# Patient Record
Sex: Male | Born: 1980 | Hispanic: No | Marital: Married
Health system: Southern US, Community
[De-identification: ages and names within clinical notes are randomized; demographics above are authoritative.]

---

## 2020-04-16 ENCOUNTER — Ambulatory Visit (HOSPITAL_BASED_OUTPATIENT_CLINIC_OR_DEPARTMENT_OTHER)
Admission: RE | Admit: 2020-04-16 | Discharge: 2020-04-16 | Disposition: A | Discharge status: Home / Self Care | Payer: 59 | Source: Ambulatory Visit | Attending: Medical | Admitting: Medical

## 2020-04-16 ENCOUNTER — Other Ambulatory Visit: Payer: Self-pay

## 2020-04-16 ENCOUNTER — Ambulatory Visit: Payer: 59 | Admitting: Medical

## 2020-04-16 VITALS — BP 102/58 | HR 103 | Temp 98.4°F | Resp 18 | Ht 68.0 in | Wt 173.4 lb

## 2020-04-16 DIAGNOSIS — Z23 Encounter for immunization: Secondary | ICD-10-CM | POA: Diagnosis not present

## 2020-04-16 DIAGNOSIS — L089 Local infection of the skin and subcutaneous tissue, unspecified: Secondary | ICD-10-CM | POA: Diagnosis not present

## 2020-04-16 DIAGNOSIS — R5383 Other fatigue: Secondary | ICD-10-CM | POA: Diagnosis not present

## 2020-04-16 DIAGNOSIS — M25562 Pain in left knee: Secondary | ICD-10-CM | POA: Insufficient documentation

## 2020-04-16 DIAGNOSIS — Z0001 Encounter for general adult medical examination with abnormal findings: Secondary | ICD-10-CM | POA: Diagnosis not present

## 2020-04-16 DIAGNOSIS — Z113 Encounter for screening for infections with a predominantly sexual mode of transmission: Secondary | ICD-10-CM

## 2020-04-16 DIAGNOSIS — Z0000 Encounter for general adult medical examination without abnormal findings: Secondary | ICD-10-CM

## 2020-04-16 MED ORDER — CEPHALEXIN 500 MG PO CAPS: 500.0000 mg | ORAL_CAPSULE | Freq: Two times a day (BID) | ORAL | 0 refills | Status: AC

## 2020-04-16 NOTE — Patient Instructions (Addendum)
For you wellness exam today I have ordered cbc, cmp, tsh, lipid panel, ua and hiv.  Recommend exercise and healthy diet.  tdap vaccine today.  We will let you know lab results as they come in.  For knee pain get xray and refer to sports med MD.  For fatigue add vit d, b12, b1 and tsh.  For depression, since you report issues at work are changing will hold off and see if you improve. But if persists can give med.  For skin infection hx rt nipple rx keflex antibiotic. Need to check area on follow up to see if area normalizes or if needs further work up.  Follow up date 1 month or as needed

## 2020-04-16 NOTE — Progress Notes (Signed)
   Subjective:    Patient ID: Mariel Craft, male    DOB: [birth year], 39 y.o.   MRN: [id number]  HPI  Pt first time with me.  Pt works at [employer]. Pt not exercising. Eating healthy diet. Rare alcohol, non smoker. Married- one child and wife pregnant.    Pt states some fatigue, stressed, some depressed mood(2 months associated with work) and work pressure. He work about 12 hour day.   Pt does not want to be on med for depression. Mild-moderate at most described. Not severe. Improving with better work conditions just recently.  Pt want physical also.   Pt did get covid vaccine. Both series. Will get tdap today.  Pt hurt his rt knee climbing onto bed 3 weeks ago. He heard small noise. Since then has  Mild to moderatepain. Injury about 3 weeks ago. Pain 3/10 today    Review of Systems  Constitutional: Negative for chills, fatigue and fever.  Respiratory: Negative for choking, shortness of breath and wheezing.   Cardiovascular: Negative for chest pain and palpitations.  Gastrointestinal: Negative for abdominal pain, constipation, nausea and vomiting.  Musculoskeletal: Negative for back pain.  Skin: Negative for rash.       One month of yellow dc that came up on rt nipple. No further dx but slight bump still present. No milky dc.   Neurological: Negative for dizziness, syncope, weakness, numbness and headaches.  Psychiatric/Behavioral: Positive for dysphoric mood. Negative for agitation and suicidal ideas. The patient is not nervous/anxious.        Objective:   Physical Exam  General Mental Status- Alert. General Appearance- Not in acute distress.   Skin General: Color- Normal Color. Moisture- Normal Moisture.  Neck Carotid Arteries- Normal color. Moisture- Normal Moisture. No carotid bruits. No JVD.  Chest and Lung Exam Auscultation: Breath Sounds:-Normal.  Cardiovascular Auscultation:Rythm- Regular. Murmurs & Other Heart Sounds:Auscultation of the heart reveals- No  Murmurs.  Abdomen Inspection:-Inspeection Normal. Palpation/Percussion:Note:No mass. Palpation and Percussion of the abdomen reveal- Non Tender, Non Distended + BS, no rebound or guarding.    Neurologic Cranial Nerve exam:- CN III-XII intact(No nystagmus), symmetric smile. Finger to Nose:- Normal/Intact Strength:- 5/5 equal and symmetric strength both upper and lower extremities.  Left knee- no swelling. No crepitus. Good rom. Mid tender to palpation over medial tibial plateau region.  Anterior chest- by rt nipple very tiny raised lump. No redness. No gynecomastia. No dc from either nipple.     Assessment & Plan:  For you wellness exam today I have ordered cbc, cmp, tsh, lipid panel, ua and hiv.  Recommend exercise and healthy diet.  tdap vaccine today.  We will let you know lab results as they come in.  For knee pain get xray and refer to sports med MD.  For fatigue add vit d, b12, b1 and tsh.  For depression, since you report issues at work are changing will hold off and see if you improve. But if persists can give med.  Follow up date 1 month or as needed  Esperanza Richters, [place]   [id number] charge extra time 15 minutes in addtion to wellness to address fatigue, knee pain, skin infection and depression.

## 2020-04-21 ENCOUNTER — Other Ambulatory Visit: Payer: Self-pay

## 2020-04-21 ENCOUNTER — Other Ambulatory Visit (INDEPENDENT_AMBULATORY_CARE_PROVIDER_SITE_OTHER): Payer: 59

## 2020-04-21 DIAGNOSIS — Z113 Encounter for screening for infections with a predominantly sexual mode of transmission: Secondary | ICD-10-CM

## 2020-04-21 DIAGNOSIS — Z0000 Encounter for general adult medical examination without abnormal findings: Secondary | ICD-10-CM | POA: Diagnosis not present

## 2020-04-21 DIAGNOSIS — R5383 Other fatigue: Secondary | ICD-10-CM | POA: Diagnosis not present

## 2020-04-21 LAB — COMPREHENSIVE METABOLIC PANEL
ALT: 26 U/L (ref 0–53)
AST: 23 U/L (ref 0–37)
Albumin: 4.4 g/dL (ref 3.5–5.2)
Alkaline Phosphatase: 64 U/L (ref 39–117)
BUN: 10 mg/dL (ref 6–23)
CO2: 28 mEq/L (ref 19–32)
Calcium: 9.1 mg/dL (ref 8.4–10.5)
Chloride: 105 mEq/L (ref 96–112)
Creatinine, Ser: 1.08 mg/dL (ref 0.40–1.50)
GFR: 76.91 mL/min (ref >60.00)
Glucose, Bld: 97 mg/dL (ref 70–99)
Potassium: 4.2 mEq/L (ref 3.5–5.1)
Sodium: 137 mEq/L (ref 135–145)
Total Bilirubin: 0.7 mg/dL (ref 0.2–1.2)
Total Protein: 7.2 g/dL (ref 6.0–8.3)

## 2020-04-21 LAB — CBC WITH DIFFERENTIAL/PLATELET
Basophils Absolute: 0 10*3/uL (ref 0.0–0.1)
Basophils Relative: 0.7 % (ref 0.0–3.0)
Eosinophils Absolute: 0.2 10*3/uL (ref 0.0–0.7)
Eosinophils Relative: 4.2 % (ref 0.0–5.0)
HCT: 47.7 % (ref 39.0–52.0)
Hemoglobin: 16.6 g/dL (ref 13.0–17.0)
Lymphocytes Relative: 33.9 % (ref 12.0–46.0)
Lymphs Abs: 1.9 10*3/uL (ref 0.7–4.0)
MCHC: 34.7 g/dL (ref 30.0–36.0)
MCV: 86.7 fl (ref 78.0–100.0)
Monocytes Absolute: 0.6 10*3/uL (ref 0.1–1.0)
Monocytes Relative: 10.8 % (ref 3.0–12.0)
Neutro Abs: 2.9 10*3/uL (ref 1.4–7.7)
Neutrophils Relative %: 50.4 % (ref 43.0–77.0)
Platelets: 155 10*3/uL (ref 150.0–400.0)
RBC: 5.51 Mil/uL (ref 4.22–5.81)
RDW: 13.1 % (ref 11.5–15.5)
WBC: 5.7 10*3/uL (ref 4.0–10.5)

## 2020-04-21 LAB — LIPID PANEL
Cholesterol: 154 mg/dL (ref 0–200)
HDL: 34.1 mg/dL — ABNORMAL LOW (ref >39.00)
LDL Cholesterol: 91 mg/dL (ref 0–99)
NonHDL: 119.5
Total CHOL/HDL Ratio: 5
Triglycerides: 144 mg/dL (ref 0.0–149.0)
VLDL: 28.8 mg/dL (ref 0.0–40.0)

## 2020-04-21 LAB — VITAMIN B12: Vitamin B-12: 80 pg/mL — ABNORMAL LOW (ref 211–911)

## 2020-04-21 LAB — TSH: TSH: 1.09 u[IU]/mL (ref 0.35–4.50)

## 2020-04-22 ENCOUNTER — Encounter: Payer: Self-pay | Admitting: Medical

## 2020-04-25 ENCOUNTER — Other Ambulatory Visit: Payer: Self-pay

## 2020-04-25 ENCOUNTER — Ambulatory Visit (INDEPENDENT_AMBULATORY_CARE_PROVIDER_SITE_OTHER): Payer: 59

## 2020-04-25 DIAGNOSIS — E538 Deficiency of other specified B group vitamins: Secondary | ICD-10-CM

## 2020-04-25 LAB — VITAMIN D 1,25 DIHYDROXY
Vitamin D 1, 25 (OH)2 Total: 37 pg/mL (ref 18–72)
Vitamin D2 1, 25 (OH)2: <8 pg/mL
Vitamin D3 1, 25 (OH)2: 37 pg/mL

## 2020-04-25 LAB — HIV ANTIBODY (ROUTINE TESTING W REFLEX): HIV 1&2 Ab, 4th Generation: NONREACTIVE

## 2020-04-25 MED ORDER — CYANOCOBALAMIN 1000 MCG/ML IJ SOLN: 1000.0000 ug | Freq: Once | INTRAMUSCULAR | 0 refills | Status: AC

## 2020-04-25 MED ORDER — CYANOCOBALAMIN 1000 MCG/ML IJ SOLN
1000.0000 ug | Freq: Once | INTRAMUSCULAR | Status: AC
  Administered 2020-04-25: 1000 ug via INTRAMUSCULAR

## 2020-04-25 NOTE — Addendum Note (Signed)
Addended by: Maximino Sarin on: 04/25/2020 10:26 AM   Modules accepted: Orders

## 2020-04-25 NOTE — Progress Notes (Signed)
Pt here for weekly B12 injection per Marisue Brooklyn  B12 given IM, right deltoid , and pt tolerated injection well.  Next B12 injection scheduled for next week .

## 2020-04-27 LAB — VITAMIN B1: Vitamin B1 (Thiamine): 10 nmol/L (ref 8–30)

## 2020-04-30 ENCOUNTER — Ambulatory Visit: Payer: 59 | Admitting: Family Medicine

## 2020-04-30 ENCOUNTER — Other Ambulatory Visit: Payer: Self-pay

## 2020-04-30 ENCOUNTER — Encounter: Payer: Self-pay | Admitting: Family Medicine

## 2020-04-30 ENCOUNTER — Ambulatory Visit: Payer: Self-pay

## 2020-04-30 ENCOUNTER — Ambulatory Visit (INDEPENDENT_AMBULATORY_CARE_PROVIDER_SITE_OTHER): Payer: 59

## 2020-04-30 VITALS — BP 120/73 | HR 59 | Ht 68.0 in | Wt 169.0 lb

## 2020-04-30 DIAGNOSIS — M25562 Pain in left knee: Secondary | ICD-10-CM

## 2020-04-30 DIAGNOSIS — S83002A Unspecified subluxation of left patella, initial encounter: Secondary | ICD-10-CM | POA: Diagnosis not present

## 2020-04-30 DIAGNOSIS — E538 Deficiency of other specified B group vitamins: Secondary | ICD-10-CM

## 2020-04-30 MED ORDER — CYANOCOBALAMIN 1000 MCG/ML IJ SOLN
1000.0000 ug | Freq: Once | INTRAMUSCULAR | Status: AC
  Administered 2020-04-30: 1000 ug via INTRAMUSCULAR

## 2020-04-30 MED ORDER — PENNSAID 2 % EX SOLN: 1.0000 "application " | Freq: Two times a day (BID) | CUTANEOUS | 3 refills | Status: AC

## 2020-04-30 NOTE — Progress Notes (Signed)
William Glass - 39 y.o. male MRN [id number]  Date of birth: Aug 19, 1981  SUBJECTIVE:  Including CC & ROS.  Chief Complaint  Patient presents with   Knee Injury    left x 03/27/2020    William Glass is a 39 y.o. male that is presenting with left knee pain.  He was getting into bed and felt a pop in his knee.  During that night he had severe pain and was unable to sleep.  The pain is slowly improved.  He has quit running and working out.  He denies any significant pain at rest.  The pain is worse with any running.  No history of surgery.  It is localized to the knee..  Independent review of the left knee x-ray from 7/21 shows no acute abnormality.   Review of Systems See HPI   HISTORY: Past Medical, Surgical, Social, and Family History Reviewed & Updated per EMR.   Pertinent Historical Findings include:  History reviewed. No pertinent past medical history.  History reviewed. No pertinent surgical history.  History reviewed. No pertinent family history.  Social History   Socioeconomic History   Marital status: Married    Spouse name: Not on file   Number of children: Not on file   Years of education: Not on file   Highest education level: Not on file  Occupational History   Not on file  Tobacco Use   Smoking status: Not on file  Substance and Sexual Activity   Alcohol use: Not on file   Drug use: Not on file   Sexual activity: Not on file  Other Topics Concern   Not on file  Social History Narrative   Not on file   Social Determinants of Health   Financial Resource Strain:    Difficulty of Paying Living Expenses:   Food Insecurity:    Worried About Running Out of Food in the Last Year:    Merchant navy officer of Food in the Last Year:   Transportation Needs:    Freight forwarder (Medical):    Lack of Transportation (Non-Medical):   Physical Activity:    Days of Exercise per Week:    Minutes of Exercise per Session:   Stress:    Feeling of Stress :     Social Connections:    Frequency of Communication with Friends and Family:    Frequency of Social Gatherings with Friends and Family:    Attends Religious Services:    Active Member of Clubs or Organizations:    Attends Engineer, structural:    Marital Status:   Intimate Partner Violence:    Fear of Current or Ex-Partner:    Emotionally Abused:    Physically Abused:    Sexually Abused:      PHYSICAL EXAM:  VS: BP 120/73    Pulse (!) 59    Ht 5\' 8"  (1.727 m)    Wt 169 lb (76.7 kg)    BMI 25.70 kg/m  Physical Exam Gen: NAD, alert, cooperative with exam, well-appearing MSK:  Left knee: No obvious effusion. Normal range of motion. No instability with valgus or varus stress testing. Normal strength resistance. Negative Thessaly test. Neurovascularly intact  Limited ultrasound: Left knee:  Mild effusion of the suprapatellar pouch. Normal-appearing quadricep and patellar tendon. Normal-appearing medial joint space and meniscus. Normal-appearing lateral joint space and meniscus  Summary:  mild effusion on exam  Ultrasound and interpretation by , MD    ASSESSMENT & PLAN:   Patellar subluxation,  left, initial encounter Given the mechanism of an injury and the amount of pain that is and it sounds like he had a subluxation.  There is a mild effusion on exam.  The meniscus testing was reassuring. -Counseled on home exercise therapy and supportive care. -Pennsaid. -Counseled on compression. -Could consider physical therapy or further imaging if needed.

## 2020-04-30 NOTE — Patient Instructions (Signed)
Nice to meet you Please try ice  Please try the rub on medicine  Please try compression  Please try the exercises  Please send me a message in MyChart with any questions or updates.  Please see me back in 4 weeks.   --Dr. Jordan Likes

## 2020-04-30 NOTE — Progress Notes (Addendum)
Pt here for weekly B12 injection per Marisue Brooklyn  B12 given IM,right deltoid  and pt tolerated injection well.  Next B12 injection scheduled for next week  Agree with administration b12 injection  for low b12.  Esperanza Richters, PA-C

## 2020-04-30 NOTE — Assessment & Plan Note (Signed)
Given the mechanism of an injury and the amount of pain that is and it sounds like he had a subluxation.  There is a mild effusion on exam.  The meniscus testing was reassuring. -Counseled on home exercise therapy and supportive care. -Pennsaid. -Counseled on compression. -Could consider physical therapy or further imaging if needed.

## 2020-05-07 ENCOUNTER — Other Ambulatory Visit: Payer: Self-pay

## 2020-05-07 ENCOUNTER — Ambulatory Visit (INDEPENDENT_AMBULATORY_CARE_PROVIDER_SITE_OTHER): Payer: 59

## 2020-05-07 DIAGNOSIS — E538 Deficiency of other specified B group vitamins: Secondary | ICD-10-CM | POA: Diagnosis not present

## 2020-05-07 MED ORDER — CYANOCOBALAMIN 1000 MCG/ML IJ SOLN
1000.0000 ug | Freq: Once | INTRAMUSCULAR | Status: AC
Start: 2020-05-07 — End: 2020-05-07
  Administered 2020-05-07: 1000 ug via INTRAMUSCULAR

## 2020-05-07 NOTE — Progress Notes (Addendum)
Pt here for weekly B12 injection per Marisue Brooklyn  B12 given IM, right deltoidand pt tolerated injection well.  Next B12 injection scheduled for next week.  Noted. Agree with above.  Jilda Roche [place], DO 05/21/20 12:15 PM

## 2020-05-09 ENCOUNTER — Ambulatory Visit: Payer: 59

## 2020-05-14 ENCOUNTER — Other Ambulatory Visit: Payer: Self-pay

## 2020-05-14 ENCOUNTER — Ambulatory Visit (INDEPENDENT_AMBULATORY_CARE_PROVIDER_SITE_OTHER): Payer: 59

## 2020-05-14 DIAGNOSIS — E538 Deficiency of other specified B group vitamins: Secondary | ICD-10-CM | POA: Diagnosis not present

## 2020-05-14 MED ORDER — CYANOCOBALAMIN 1000 MCG/ML IJ SOLN
1000.0000 ug | Freq: Once | INTRAMUSCULAR | Status: AC
  Administered 2020-05-14: 1000 ug via INTRAMUSCULAR

## 2020-05-14 NOTE — Progress Notes (Addendum)
Pt here for weekly B12 injection per Marisue Brooklyn  B12 given IM, right deltoid and pt tolerated injection well.  4 out of 4 weekly injections completed , follow up with PCP next week.   Agree with administration for low b12.  Esperanza Richters, PA-C

## 2020-05-20 ENCOUNTER — Other Ambulatory Visit: Payer: Self-pay

## 2020-05-20 ENCOUNTER — Ambulatory Visit (INDEPENDENT_AMBULATORY_CARE_PROVIDER_SITE_OTHER): Payer: 59 | Admitting: Medical

## 2020-05-20 VITALS — BP 108/51 | HR 68 | Temp 98.4°F | Resp 18 | Ht 68.0 in | Wt 173.4 lb

## 2020-05-20 DIAGNOSIS — L723 Sebaceous cyst: Secondary | ICD-10-CM

## 2020-05-20 DIAGNOSIS — L659 Nonscarring hair loss, unspecified: Secondary | ICD-10-CM

## 2020-05-20 DIAGNOSIS — E538 Deficiency of other specified B group vitamins: Secondary | ICD-10-CM

## 2020-05-20 MED ORDER — CEPHALEXIN 500 MG PO CAPS
500.0000 mg | ORAL_CAPSULE | Freq: Two times a day (BID) | ORAL | 0 refills | Status: AC
Start: 2020-05-20 — End: ?

## 2020-05-20 NOTE — Patient Instructions (Addendum)
Your b12 was low and have had supplementation now for one month.  Will repeat level within next week and decide on what level of supplementation will need going forward.  For rt nipple healing with possible sebacious cyst vs infection will rx 4 more days of keflex and advise warm compresses. If area does not heal then will refer to derm to evaluate the area. Maybe they can better view magnification. After discussion will go ahead and refer also for hair loss in beard.  Follow up date to be determined after lab review.   Vitamin B 12 Deficiency Vitamin B 12 deficiency occurs when the body does not have enough vitamin B12, which is an important vitamin. The body needs this vitamin:  To make red blood cells.  To make DNA. This is the genetic material inside cells.  To help the nerves work properly so they can carry messages from the brain to the body. Vitamin B12 deficiency can cause various health problems, such as a low red blood cell count (anemia) or nerve damage. What are the causes? This condition may be caused by:  Not eating enough foods that contain vitamin B12.  Not having enough stomach acid and digestive fluids to properly absorb vitamin B12 from the food that you eat.  Certain digestive system diseases that make it hard to absorb vitamin B12. These diseases include Crohn's disease, chronic pancreatitis, and cystic fibrosis.  A condition in which the body does not make enough of a protein (intrinsic factor), resulting in too few red blood cells (pernicious anemia).  Having a surgery in which part of the stomach or small intestine is removed.  Taking certain medicines that make it hard for the body to absorb vitamin B12. These medicines include: ? Heartburn medicines (antacids and proton pump inhibitors). ? Certain antibiotic medicines. ? Some medicines that are used to treat diabetes, tuberculosis, gout, or high cholesterol. What increases the risk? The following factors  may make you more likely to develop a B12 deficiency:  Being older than age 33.  Eating a vegetarian or vegan diet, especially while you are pregnant.  Eating a poor diet while you are pregnant.  Taking certain medicines.  Having alcoholism. What are the signs or symptoms? In some cases, there are no symptoms of this condition. If the condition leads to anemia or nerve damage, various symptoms can occur, such as:  Weakness.  Fatigue.  Loss of appetite.  Weight loss.  Numbness or tingling in your hands and feet.  Redness and burning of the tongue.  Confusion or memory problems.  Depression.  Sensory problems, such as color blindness, ringing in the ears, or loss of taste.  Diarrhea or constipation.  Trouble walking. If anemia is severe, symptoms can include:  Shortness of breath.  Dizziness.  Rapid heart rate (tachycardia). How is this diagnosed? This condition may be diagnosed with a blood test to measure the level of vitamin B12 in your blood. You may also have other tests, including:  A group of tests that measure certain characteristics of blood cells (complete blood count, CBC).  A blood test to measure intrinsic factor.  A procedure where a thin tube with a camera on the end is used to look into your stomach or intestines (endoscopy). Other tests may be needed to discover the cause of B12 deficiency. How is this treated? Treatment for this condition depends on the cause. This condition may be treated by:  Changing your eating and drinking habits, such as: ?  Eating more foods that contain vitamin B12. ? Drinking less alcohol or no alcohol.  Getting vitamin B12 injections.  Taking vitamin B12 supplements. Your health care provider will tell you which dosage is best for you. Follow these instructions at home: Eating and drinking   Eat lots of healthy foods that contain vitamin B12, including: ? Meats and poultry. This includes beef, pork, chicken,  [place], and organ meats, such as liver. ? Seafood. This includes clams, rainbow trout, salmon, tuna, and haddock. ? Eggs. ? Cereal and dairy products that are fortified. This means that vitamin B12 has been added to the food. Check the label on the package to see if the food is fortified. The items listed above may not be a complete list of recommended foods and beverages. Contact a dietitian for more information. General instructions  Get any injections that are prescribed by your health care provider.  Take supplements only as told by your health care provider. Follow the directions carefully.  Do not drink alcohol if your health care provider tells you not to. In some cases, you may only be asked to limit alcohol use.  Keep all follow-up visits as told by your health care provider. This is important. Contact a health care provider if:  Your symptoms come back. Get help right away if you:  Develop shortness of breath.  Have a rapid heart rate.  Have chest pain.  Become dizzy or lose consciousness. Summary  Vitamin B12 deficiency occurs when the body does not have enough vitamin B12.  The main causes of vitamin B12 deficiency include dietary deficiency, digestive diseases, pernicious anemia, and having a surgery in which part of the stomach or small intestine is removed.  In some cases, there are no symptoms of this condition. If the condition leads to anemia or nerve damage, various symptoms can occur, such as weakness, shortness of breath, and numbness.  Treatment may include getting vitamin B12 injections or taking vitamin B12 supplements. Eat lots of healthy foods that contain vitamin B12. This information is not intended to replace advice given to you by your health care provider. Make sure you discuss any questions you have with your health care provider. Document Revised: 03/02/2019 Document Reviewed: 05/23/2018 Elsevier Patient Education  2020 [employer].

## 2020-05-20 NOTE — Progress Notes (Signed)
   Subjective:    Patient ID: William Glass, male    DOB: Sep 05, 1981, 39 y.o.   MRN: [id number]  HPI  Pt in for follow up.  Pt has low b12 in past. Now has done 4 consecutive weeks of b12 injections and has been on otc b12 as well. 2500 mcg dose.  He states his energy is much improved now. Still occasional fatigue.  Also follow up on rt nipple area which had dc. Pt took antibiotic and now her for follow up. Area better but still tiny white bump.  Also recent thinning of beard on left sid.e  Review of Systems     Objective:   Physical Exam  General- No acute distress. Pleasant patient. Neck- Full range of motion, no jvd Lungs- Clear, even and unlabored. Heart- regular rate and rhythm. Neurologic- CNII- XII grossly intact. Skin- rt nipple. Area better. Now has very tiny raise white bump. Also left side of beard small area of thinning hair.      Assessment & Plan:  Your b12 was low and have had supplementation now for one month.  Will repeat level within next week and decide on what level of supplementation will need going forward.  Follow up date to be determined after lab review.  Time spent with patient today was 20  minutes which consisted of chart revdiew, discussing diagnosis, work up, treatment, referral and documentation.

## 2020-05-30 ENCOUNTER — Ambulatory Visit: Payer: 59

## 2020-05-30 ENCOUNTER — Other Ambulatory Visit: Payer: 59

## 2020-05-30 ENCOUNTER — Other Ambulatory Visit: Payer: Self-pay

## 2020-05-30 DIAGNOSIS — E538 Deficiency of other specified B group vitamins: Secondary | ICD-10-CM

## 2020-05-31 LAB — VITAMIN B12: Vitamin B-12: 768 pg/mL (ref 200–1100)

## 2020-06-03 ENCOUNTER — Ambulatory Visit: Payer: 59 | Admitting: Family Medicine

## 2020-06-20 ENCOUNTER — Ambulatory Visit: Payer: 59

## 2021-07-13 IMAGING — DX DG KNEE 3 VIEWS*L*
3 series · 3 of 3 positions shown · non-contrast
Comparison: None.

CLINICAL DATA: Knee pain after injury 1 week ago.

EXAM:
LEFT KNEE - 3 VIEW

[knee ap]
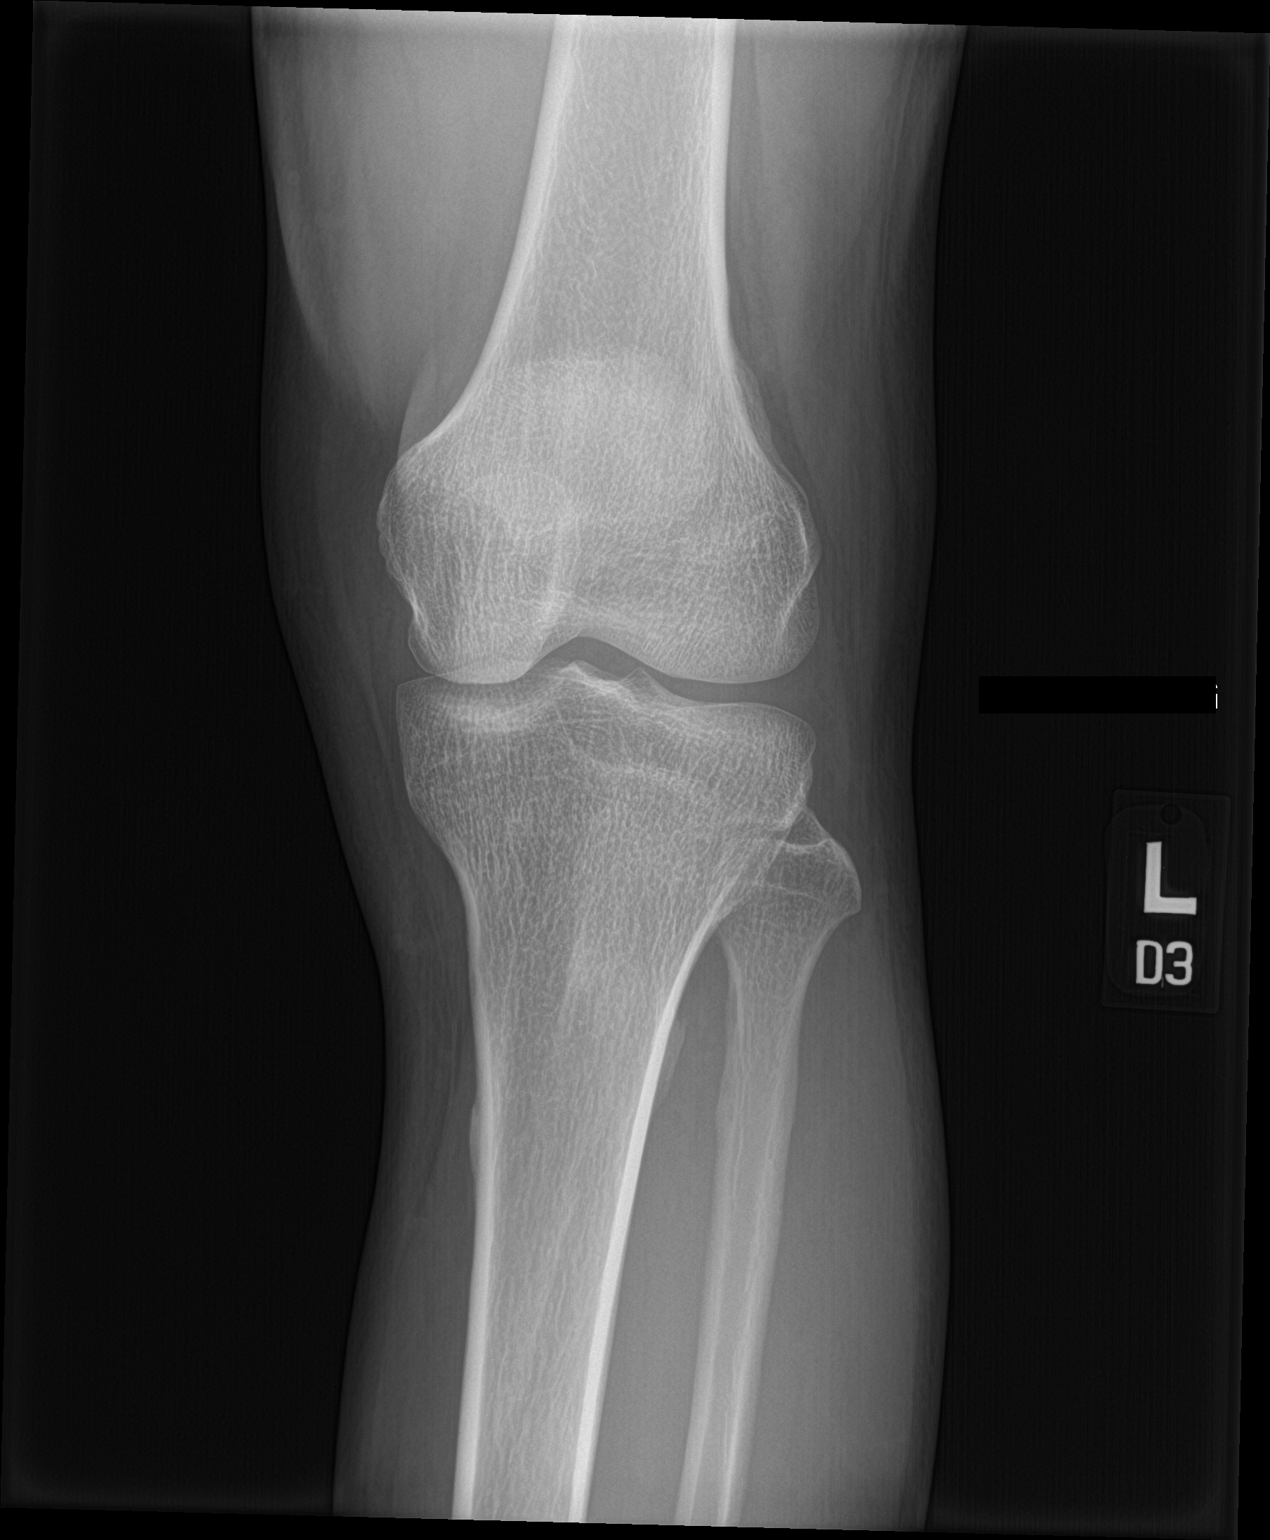

[knee lat]
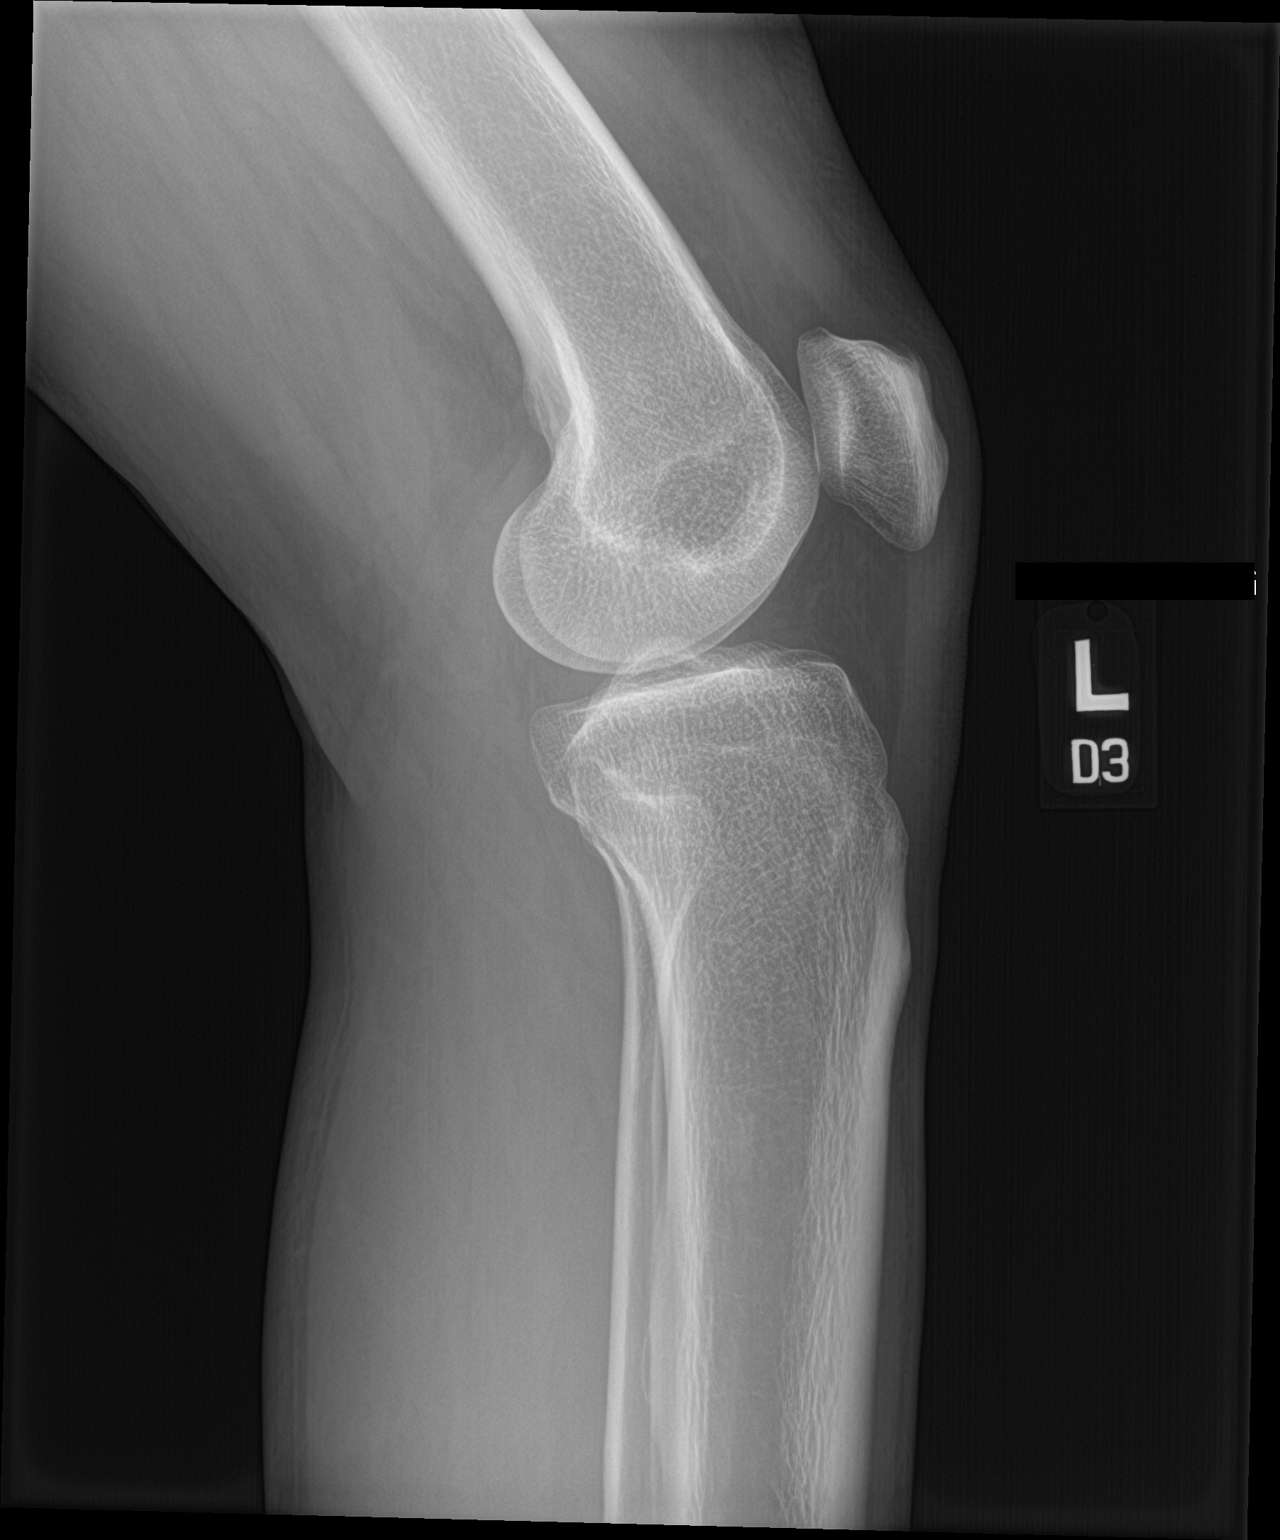

[knee sunrise]
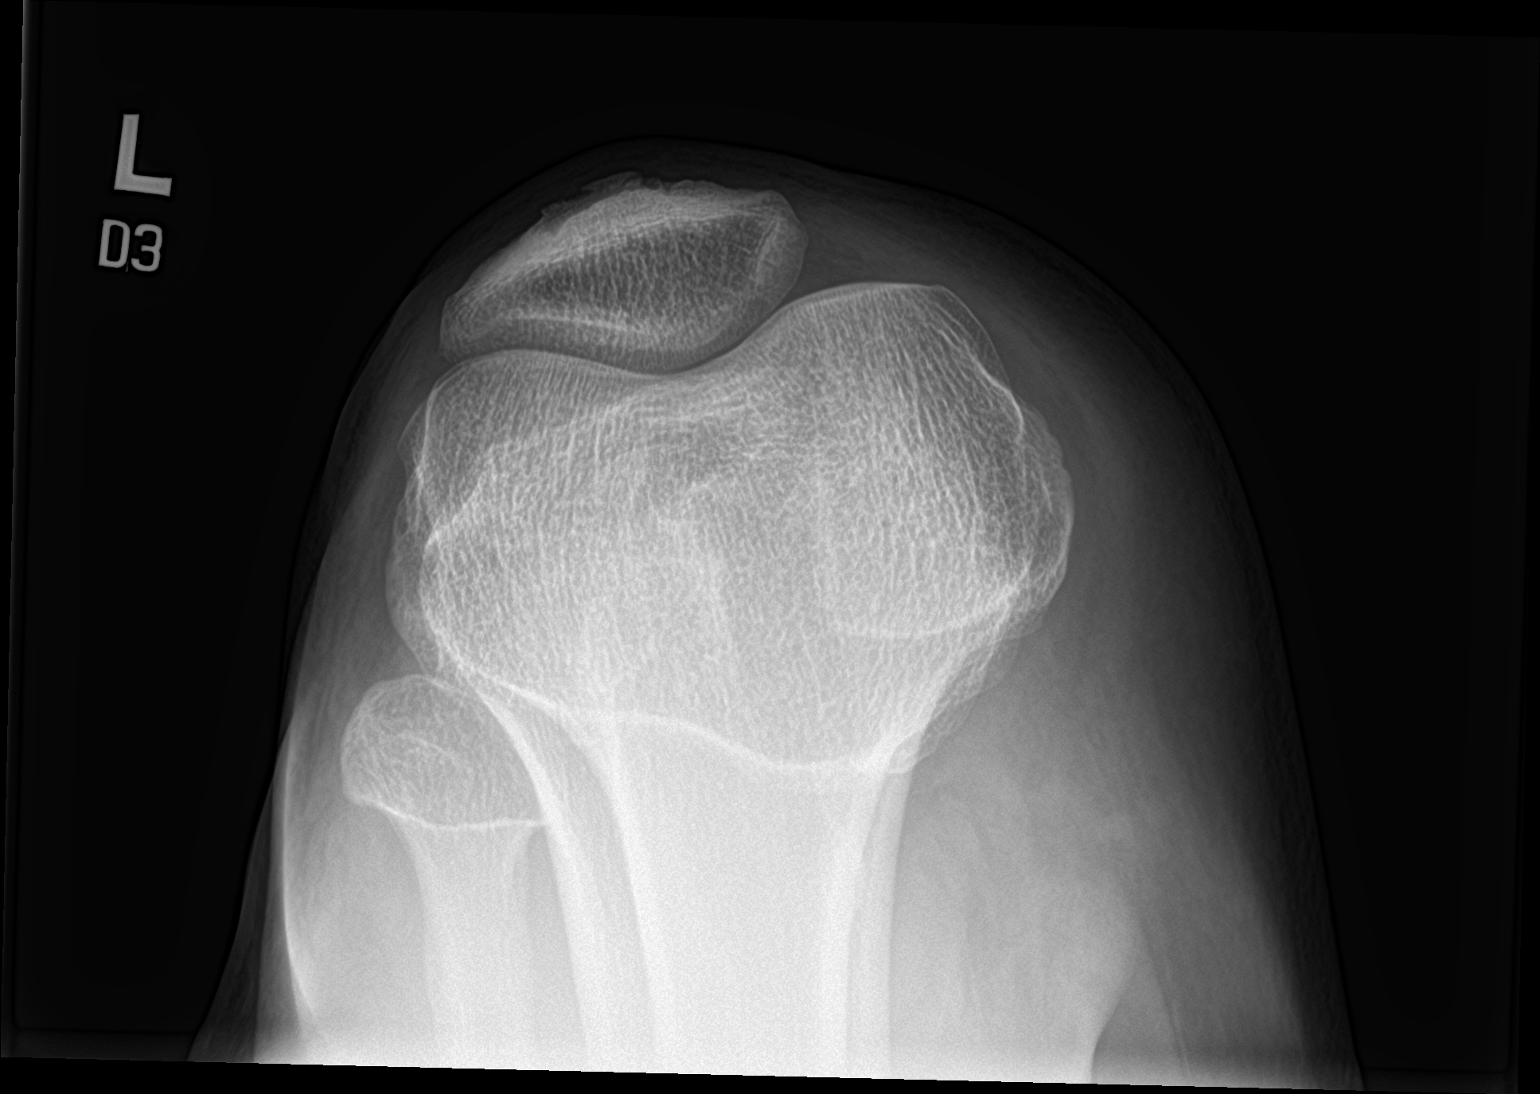

[3 of 3 positions shown; findings below may reference images not displayed]

FINDINGS: No evidence of fracture, dislocation, or joint effusion. No evidence
of arthropathy or other focal bone abnormality. Soft tissues are
unremarkable.
IMPRESSION: Negative.

## 2023-01-10 ENCOUNTER — Encounter: Payer: Self-pay | Admitting: *Deleted
